# Patient Record
Sex: Female | Born: 1960 | Race: White | Hispanic: No | Marital: Married
Health system: Southern US, Community
[De-identification: ages and names within clinical notes are randomized; demographics above are authoritative.]

---

## 2017-08-17 ENCOUNTER — Other Ambulatory Visit: Payer: Self-pay | Admitting: Nurse Practitioner

## 2017-08-17 DIAGNOSIS — Z139 Encounter for screening, unspecified: Secondary | ICD-10-CM

## 2017-09-05 ENCOUNTER — Ambulatory Visit
Admission: RE | Admit: 2017-09-05 | Discharge: 2017-09-05 | Disposition: A | Discharge status: Home / Self Care | Payer: BLUE CROSS/BLUE SHIELD | Source: Ambulatory Visit | Attending: Nurse Practitioner | Admitting: Nurse Practitioner

## 2017-09-05 DIAGNOSIS — Z139 Encounter for screening, unspecified: Secondary | ICD-10-CM

## 2018-08-12 ENCOUNTER — Other Ambulatory Visit: Payer: Self-pay | Admitting: Nurse Practitioner

## 2018-08-12 DIAGNOSIS — Z1231 Encounter for screening mammogram for malignant neoplasm of breast: Secondary | ICD-10-CM

## 2018-09-10 ENCOUNTER — Ambulatory Visit
Admission: RE | Admit: 2018-09-10 | Discharge: 2018-09-10 | Disposition: A | Discharge status: Home / Self Care | Payer: BLUE CROSS/BLUE SHIELD | Source: Ambulatory Visit | Attending: Nurse Practitioner | Admitting: Nurse Practitioner

## 2018-09-10 DIAGNOSIS — Z1231 Encounter for screening mammogram for malignant neoplasm of breast: Secondary | ICD-10-CM

## 2019-09-26 ENCOUNTER — Ambulatory Visit: Payer: BC Managed Care – PPO | Attending: Internal Medicine

## 2019-09-26 DIAGNOSIS — Z23 Encounter for immunization: Secondary | ICD-10-CM

## 2019-09-26 NOTE — Progress Notes (Signed)
   Covid-19 Vaccination Clinic  Name:  Melanie Nolan    MRN: [id number] DOB: [birth year]  09/26/2019  Melanie Nolan was observed post Covid-19 immunization for 15 minutes without incident. She was provided with Vaccine Information Sheet and instruction to access the V-Safe system.   Melanie Nolan was instructed to call 911 with any severe reactions post vaccine: Marland Kitchen Difficulty breathing  . Swelling of face and throat  . A fast heartbeat  . A bad rash all over body  . Dizziness and weakness   Immunizations Administered    Name Date Dose VIS Date Route   Moderna COVID-19 Vaccine 09/26/2019 11:14 AM 0.5 mL 06/10/2019 Intramuscular   Manufacturer: Moderna   Lot: [id number]   NDC: 28366-294-76

## 2019-10-28 ENCOUNTER — Ambulatory Visit: Payer: BC Managed Care – PPO | Attending: Internal Medicine

## 2019-10-28 DIAGNOSIS — Z23 Encounter for immunization: Secondary | ICD-10-CM

## 2019-10-28 NOTE — Progress Notes (Signed)
   Covid-19 Vaccination Clinic  Name:  Melanie Nolan    MRN: [id number] DOB: 08-Dec-1960  10/28/2019  [name] was observed post Covid-19 immunization for 15 minutes without incident. She was provided with Vaccine Information Sheet and instruction to access the V-Safe system.   [name] was instructed to call 911 with any severe reactions post vaccine: Marland Kitchen Difficulty breathing  . Swelling of face and throat  . A fast heartbeat  . A bad rash all over body  . Dizziness and weakness   Immunizations Administered    Name Date Dose VIS Date Route   Moderna COVID-19 Vaccine 10/28/2019  8:35 AM 0.5 mL 06/2019 Intramuscular   Manufacturer: Moderna   Lot: [id number]   NDC: 80881-103-15

## 2020-03-19 IMAGING — MG DIGITAL SCREENING BILATERAL MAMMOGRAM WITH TOMO AND CAD
8 series · 9 of 24 positions shown · non-contrast
Comparison: Previous exam(s).

CLINICAL DATA: Screening.

EXAM:
DIGITAL SCREENING BILATERAL MAMMOGRAM WITH TOMO AND CAD

[R CC synth-2D]
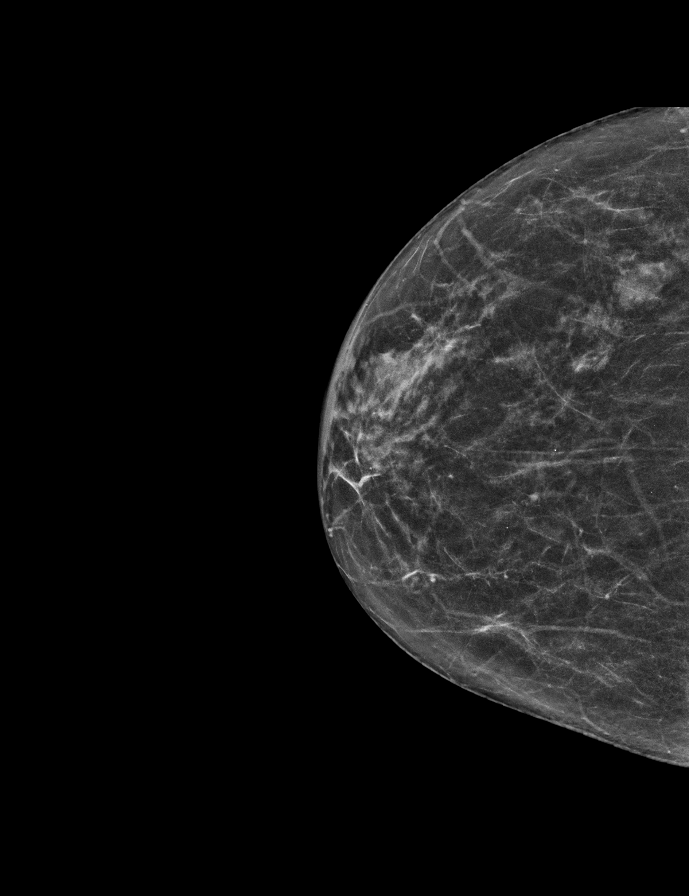

[L MLO synth-2D]
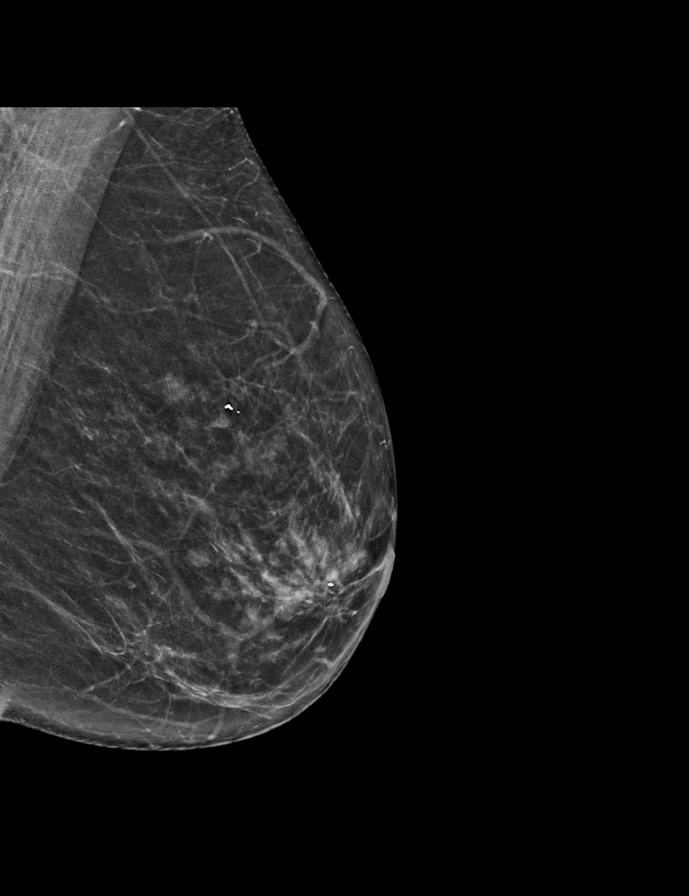

[L CC synth-2D]
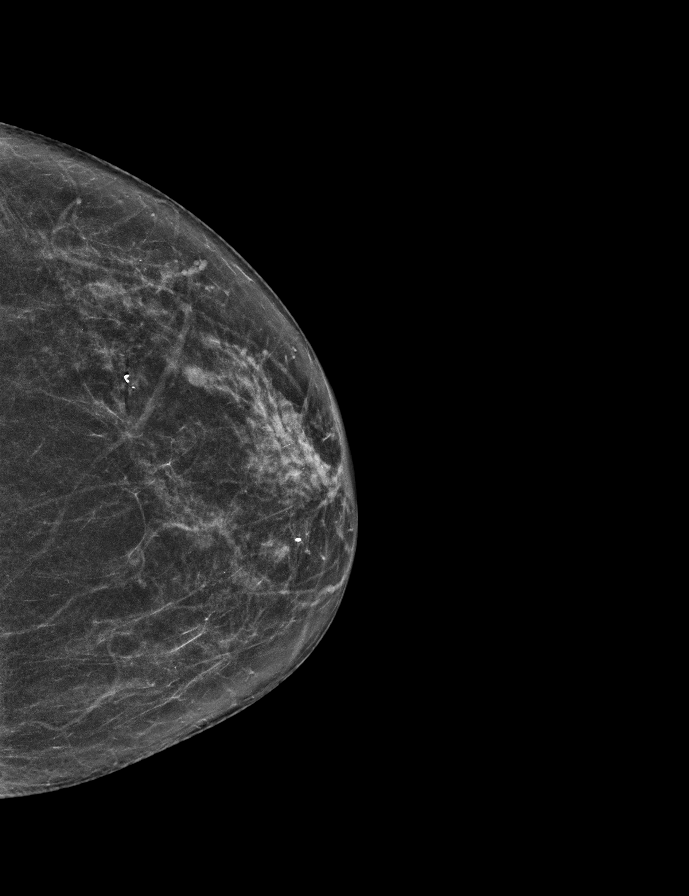

[R MLO synth-2D]
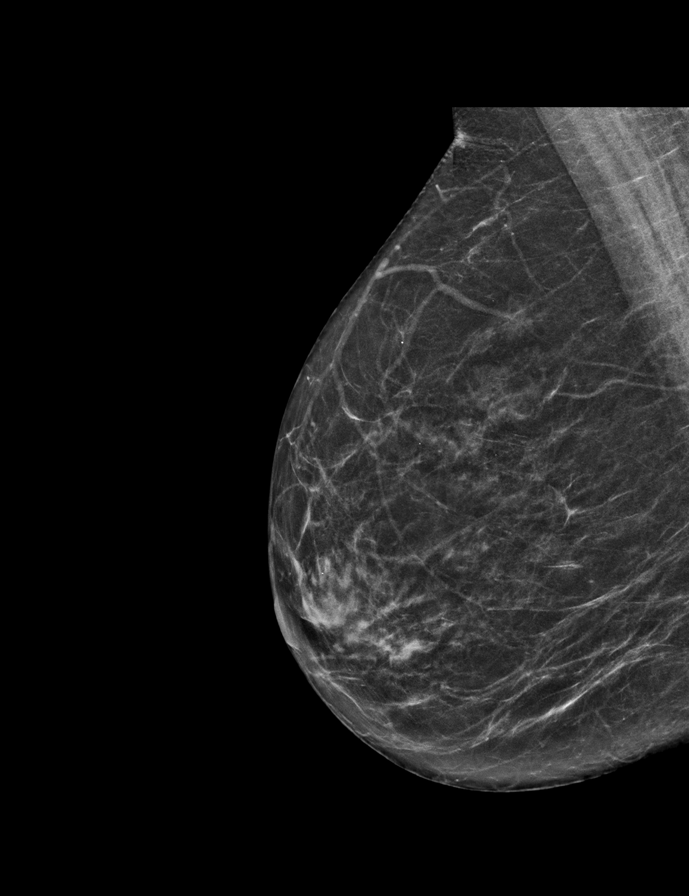

[L CC tomo · 2 of 55 frames shown]
[frame 18/55]
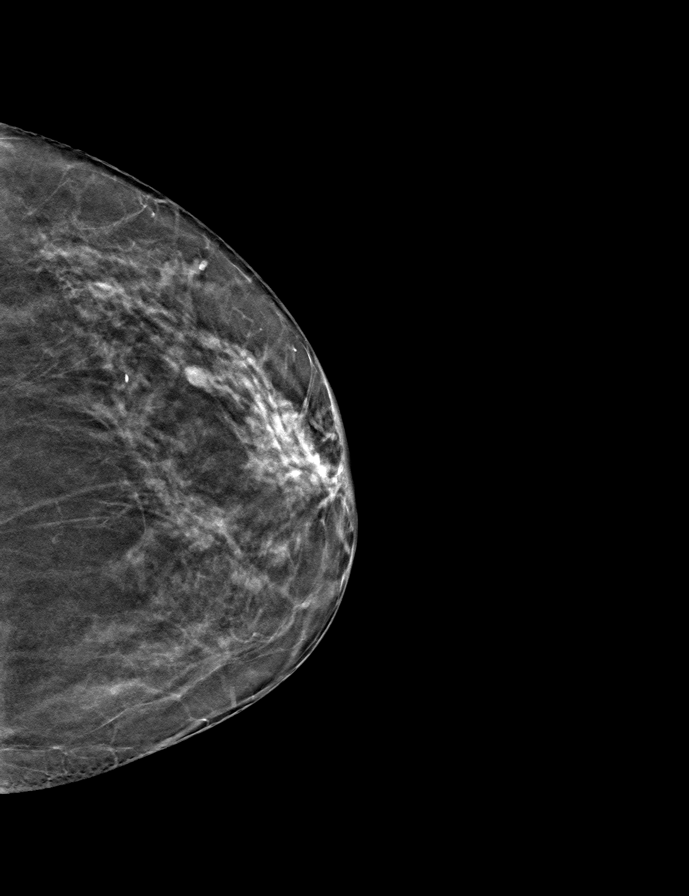
[frame 28/55]
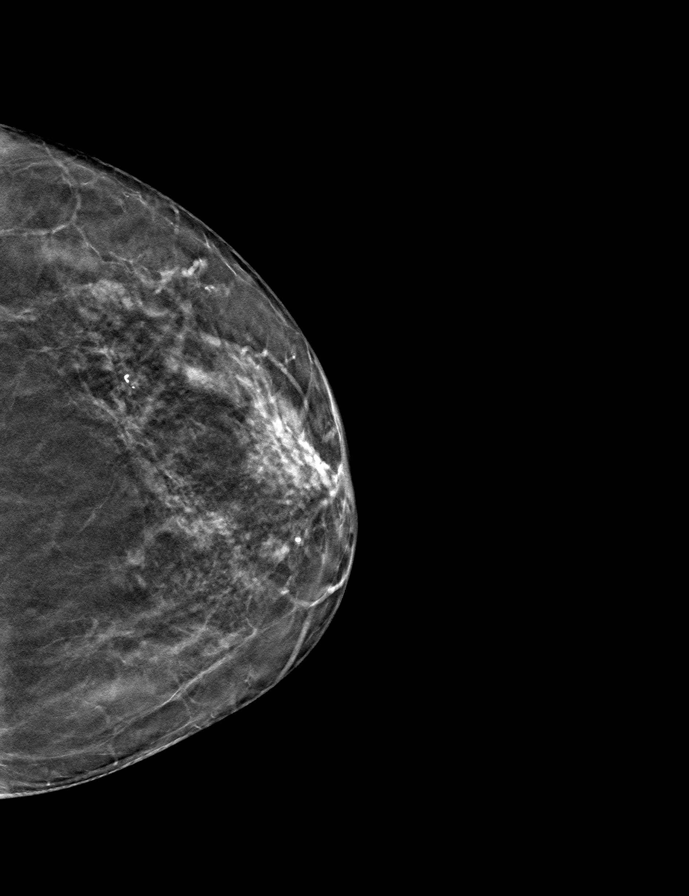

[R MLO tomo · tomo slice 28/55.0]
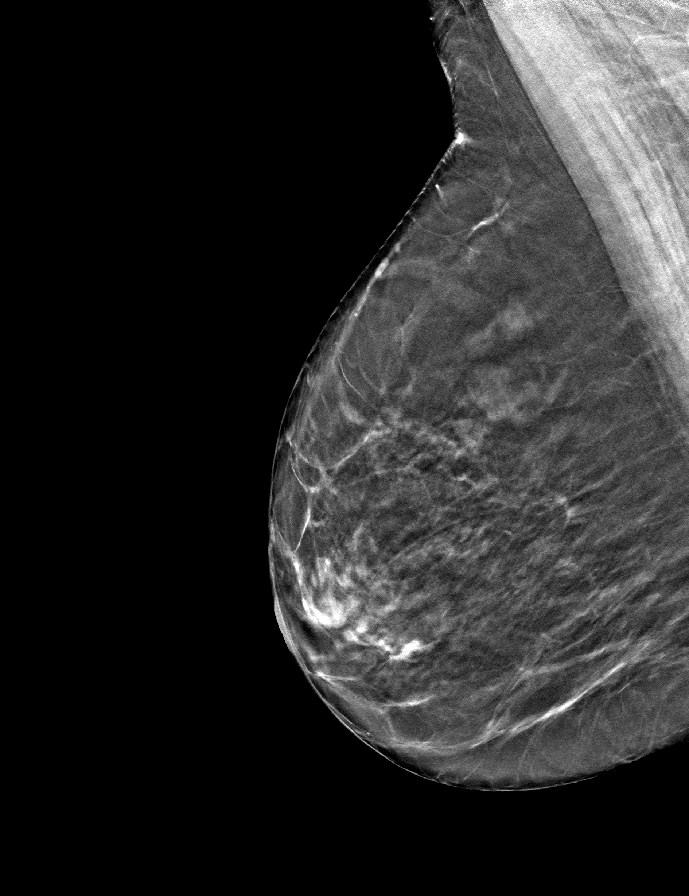

[L MLO tomo · tomo slice 29/58.0]
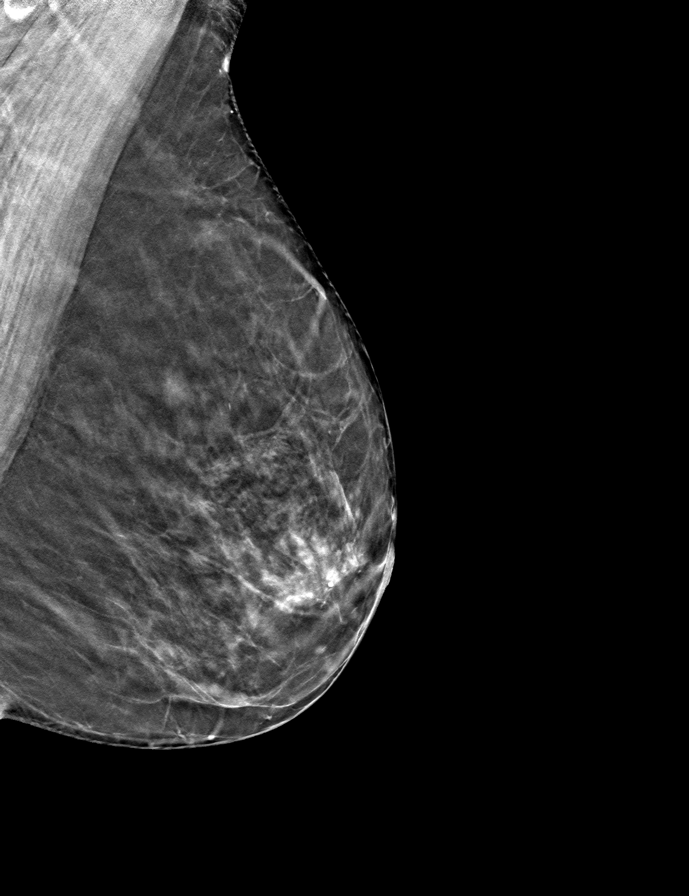

[R CC tomo · tomo slice 27/52.0]
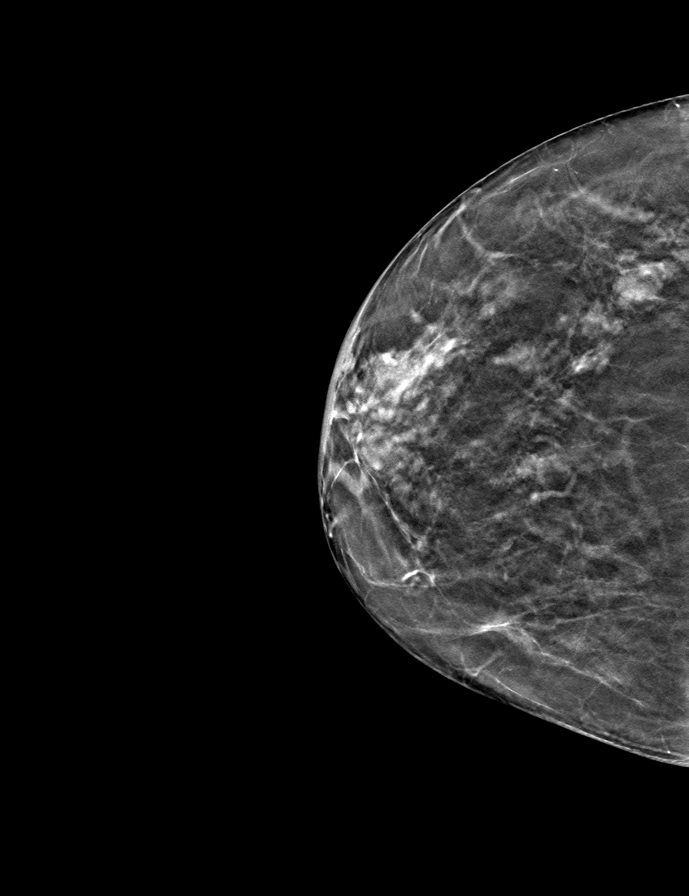

[9 of 24 positions shown; findings below may reference images not displayed]

ACR Breast Density Category b: There are scattered areas of
fibroglandular density.
FINDINGS: There are no findings suspicious for malignancy. Images were
processed with CAD.
IMPRESSION: No mammographic evidence of malignancy. A result letter of this
screening mammogram will be mailed directly to the patient.

RECOMMENDATION:
Screening mammogram in one year. (Code:[id number])

BI-RADS CATEGORY  1: Negative.
